# Patient Record
Sex: Male | Born: 1970 | Race: White | Hispanic: No | Marital: Married | Smoking: Never smoker
Health system: Southern US, Community
[De-identification: ages and names within clinical notes are randomized; demographics above are authoritative.]

---

## 2006-06-16 ENCOUNTER — Ambulatory Visit: Payer: Self-pay | Admitting: General Practice

## 2016-03-21 ENCOUNTER — Ambulatory Visit: Payer: Self-pay | Admitting: Family Medicine

## 2016-03-23 ENCOUNTER — Other Ambulatory Visit: Payer: Self-pay | Admitting: Family Medicine

## 2016-03-23 ENCOUNTER — Ambulatory Visit (INDEPENDENT_AMBULATORY_CARE_PROVIDER_SITE_OTHER): Admitting: Family Medicine

## 2016-03-23 ENCOUNTER — Encounter: Payer: Self-pay | Admitting: Family Medicine

## 2016-03-23 ENCOUNTER — Other Ambulatory Visit: Payer: Self-pay

## 2016-03-23 VITALS — BP 110/80 | HR 81 | Temp 98.1°F | Resp 16 | Ht 69.0 in | Wt 212.0 lb

## 2016-03-23 DIAGNOSIS — E669 Obesity, unspecified: Secondary | ICD-10-CM | POA: Diagnosis not present

## 2016-03-23 DIAGNOSIS — M179 Osteoarthritis of knee, unspecified: Secondary | ICD-10-CM | POA: Insufficient documentation

## 2016-03-23 DIAGNOSIS — R748 Abnormal levels of other serum enzymes: Secondary | ICD-10-CM | POA: Diagnosis not present

## 2016-03-23 DIAGNOSIS — M1710 Unilateral primary osteoarthritis, unspecified knee: Secondary | ICD-10-CM | POA: Insufficient documentation

## 2016-03-23 DIAGNOSIS — H6981 Other specified disorders of Eustachian tube, right ear: Secondary | ICD-10-CM | POA: Diagnosis not present

## 2016-03-23 DIAGNOSIS — Z6831 Body mass index (BMI) 31.0-31.9, adult: Secondary | ICD-10-CM | POA: Insufficient documentation

## 2016-03-23 MED ORDER — GLUCOSAMINE-CHONDROITIN 500-400 MG PO TABS: 1.0000 | ORAL_TABLET | Freq: Two times a day (BID) | ORAL | Status: AC

## 2016-03-23 NOTE — Progress Notes (Signed)
Date:  03/23/2016   Name:  Wayne Mcgee   DOB:  1971/05/14   MRN:  [id number]  PCP:  Schuyler AmorWilliam Lonetta Blassingame, MD    Chief Complaint: Establish Care   History of Present Illness:  This is a 45 y.o. male for initial visit. Active [employer]. Placed on abx for R ear infection 2 weeks ago, better but still having sinus congestion and ear popping. Taking G/C for int knee pain. Weight up recently, just started new diet. Father died with alcoholism, mother died with brain infection. Last tetanus 2015.   Review of Systems:  Review of Systems  Constitutional: Negative for fever and fatigue.  Respiratory: Negative for cough and shortness of breath.   Cardiovascular: Negative for chest pain and leg swelling.  Genitourinary: Negative for difficulty urinating.  Neurological: Negative for syncope and light-headedness.    Patient Active Problem List   Diagnosis Date Noted  . BMI 31.0-31.9,adult 03/23/2016    Prior to Admission medications   Not on File    No Known Allergies  History reviewed. No pertinent past surgical history.  Social History  Substance Use Topics  . Smoking status: Never Smoker   . Smokeless tobacco: Never Used  . Alcohol Use: No    History reviewed. No pertinent family history.  Medication list has been reviewed and updated.  Physical Examination: BP 110/80 mmHg  Pulse 81  Temp(Src) 98.1 F (36.7 C) (Oral)  Resp 16  Ht 5\' 9"  (1.753 m)  Wt 212 lb (96.163 kg)  BMI 31.29 kg/m2  SpO2 97%  Physical Exam  Constitutional: He is oriented to person, place, and time. He appears well-developed and well-nourished.  HENT:  Head: Normocephalic and atraumatic.  Right Ear: External ear normal.  Left Ear: External ear normal.  Nose: Nose normal.  Mouth/Throat: Oropharynx is clear and moist.  R TM retracted  Eyes: Conjunctivae and EOM are normal. Pupils are equal, round, and reactive to light.  Neck: Neck supple. No thyromegaly present.  Cardiovascular: Normal rate,  regular rhythm and normal heart sounds.   Pulmonary/Chest: Effort normal and breath sounds normal.  Abdominal: Soft. He exhibits no distension and no mass. There is no tenderness.  Musculoskeletal: He exhibits no edema.  Lymphadenopathy:    He has no cervical adenopathy.  Neurological: He is alert and oriented to person, place, and time. Coordination normal.  Skin: Skin is warm and dry.  Psychiatric: He has a normal mood and affect. His behavior is normal.  Nursing note and vitals reviewed.   Assessment and Plan:  1. ETD (eustachian tube dysfunction), right Sudafed or Afrin x 3d  2. BMI 31.0-31.9,adult Diet/exercise discussed - Comprehensive Metabolic Panel (CMET) - CBC - Lipid Profile  Return in about 6 months (around 09/23/2016).  Dionne AnoWilliam M. Kingsley SpittlePlonk, Jr. MD [facility]  03/23/2016

## 2016-03-24 LAB — LIPID PANEL
CHOLESTEROL TOTAL: 227 mg/dL — AB (ref 100–199)
Chol/HDL Ratio: 4.4 ratio units (ref 0.0–5.0)
HDL: 52 mg/dL (ref >39)
LDL Calculated: 141 mg/dL — ABNORMAL HIGH (ref 0–99)
TRIGLYCERIDES: 169 mg/dL — AB (ref 0–149)
VLDL Cholesterol Cal: 34 mg/dL (ref 5–40)

## 2016-03-24 LAB — COMPREHENSIVE METABOLIC PANEL
A/G RATIO: 2 (ref 1.2–2.2)
ALBUMIN: 4.9 g/dL (ref 3.5–5.5)
ALK PHOS: 85 IU/L (ref 39–117)
ALT: 54 IU/L — ABNORMAL HIGH (ref 0–44)
AST: 44 IU/L — ABNORMAL HIGH (ref 0–40)
BILIRUBIN TOTAL: 0.5 mg/dL (ref 0.0–1.2)
BUN / CREAT RATIO: 21 — AB (ref 9–20)
BUN: 22 mg/dL (ref 6–24)
CHLORIDE: 98 mmol/L (ref 96–106)
CO2: 25 mmol/L (ref 18–29)
Calcium: 9.5 mg/dL (ref 8.7–10.2)
Creatinine, Ser: 1.04 mg/dL (ref 0.76–1.27)
GFR calc non Af Amer: 87 mL/min/{1.73_m2} (ref >59)
GFR, EST AFRICAN AMERICAN: 100 mL/min/{1.73_m2} (ref >59)
GLUCOSE: 73 mg/dL (ref 65–99)
Globulin, Total: 2.5 g/dL (ref 1.5–4.5)
POTASSIUM: 4.6 mmol/L (ref 3.5–5.2)
SODIUM: 139 mmol/L (ref 134–144)
TOTAL PROTEIN: 7.4 g/dL (ref 6.0–8.5)

## 2016-03-24 LAB — CBC
Hematocrit: 44.7 % (ref 37.5–51.0)
Hemoglobin: 15.3 g/dL (ref 12.6–17.7)
MCH: 29.7 pg (ref 26.6–33.0)
MCHC: 34.2 g/dL (ref 31.5–35.7)
MCV: 87 fL (ref 79–97)
PLATELETS: 265 10*3/uL (ref 150–379)
RBC: 5.15 x10E6/uL (ref 4.14–5.80)
RDW: 12.8 % (ref 12.3–15.4)
WBC: 7.8 10*3/uL (ref 3.4–10.8)

## 2016-03-24 NOTE — Addendum Note (Signed)
Addended by: Schuyler AmorPLONK, Janaiyah Blackard on: 03/24/2016 09:50 AM   Modules accepted: Orders

## 2016-03-31 ENCOUNTER — Ambulatory Visit
Admission: RE | Admit: 2016-03-31 | Discharge: 2016-03-31 | Disposition: A | Discharge status: Home / Self Care | Source: Ambulatory Visit | Attending: Family Medicine | Admitting: Family Medicine

## 2016-03-31 ENCOUNTER — Other Ambulatory Visit: Payer: Self-pay | Admitting: Family Medicine

## 2016-03-31 DIAGNOSIS — R748 Abnormal levels of other serum enzymes: Secondary | ICD-10-CM

## 2016-04-01 ENCOUNTER — Telehealth: Payer: Self-pay

## 2016-04-01 NOTE — Telephone Encounter (Signed)
-----   Message from Schuyler AmorWilliam Plonk, MD sent at 03/31/2016  1:54 PM EDT ----- Inform liver US normal, recommend limit alcohol use to 2 drinks daily and return next week for repeat blood test (ordered).

## 2016-04-01 NOTE — Telephone Encounter (Signed)
Spoke with pt. Wife. Advised and will be by next week to have labs drawn.

## 2016-04-07 ENCOUNTER — Other Ambulatory Visit: Payer: Self-pay

## 2016-04-07 ENCOUNTER — Other Ambulatory Visit: Payer: Self-pay | Admitting: Family Medicine

## 2016-04-08 LAB — HEPATITIS, DIAGNOSTIC (PROF I)
HEP A IGM: NEGATIVE
HEP B C IGM: NEGATIVE
HEP B S AG: NEGATIVE

## 2016-04-08 LAB — HEPATITIS C ANTIBODY

## 2016-04-08 LAB — HEPATIC FUNCTION PANEL
ALBUMIN: 4.9 g/dL (ref 3.5–5.5)
ALT: 32 IU/L (ref 0–44)
AST: 36 IU/L (ref 0–40)
Alkaline Phosphatase: 89 IU/L (ref 39–117)
Bilirubin Total: 0.5 mg/dL (ref 0.0–1.2)
Bilirubin, Direct: 0.15 mg/dL (ref 0.00–0.40)
TOTAL PROTEIN: 7.4 g/dL (ref 6.0–8.5)

## 2018-01-31 ENCOUNTER — Encounter: Payer: Self-pay | Admitting: Emergency Medicine

## 2018-01-31 ENCOUNTER — Other Ambulatory Visit: Payer: Self-pay

## 2018-01-31 ENCOUNTER — Ambulatory Visit
Admission: EM | Admit: 2018-01-31 | Discharge: 2018-01-31 | Disposition: A | Discharge status: Home / Self Care | Attending: Family Medicine | Admitting: Family Medicine

## 2018-01-31 DIAGNOSIS — M2550 Pain in unspecified joint: Secondary | ICD-10-CM | POA: Diagnosis not present

## 2018-01-31 DIAGNOSIS — M26622 Arthralgia of left temporomandibular joint: Secondary | ICD-10-CM | POA: Diagnosis not present

## 2018-01-31 DIAGNOSIS — H9202 Otalgia, left ear: Secondary | ICD-10-CM

## 2018-01-31 DIAGNOSIS — M26629 Arthralgia of temporomandibular joint, unspecified side: Secondary | ICD-10-CM

## 2018-01-31 NOTE — ED Provider Notes (Signed)
[facility]    CSN: [id number] Arrival date & time: 01/31/18  0921     History   Chief Complaint Chief Complaint  Patient presents with  . Jaw Pain  . Otalgia    left    HPI Wayne Mcgee is a 47 y.o. male.   47 yo male with a c/o left jaw and ear pain for the past week, worse when chewing. Denies any injuries, trauma, fevers, chills, ear drainage, redness, rash. States has been under more stress recently and may have been grinding his teeth.   Also c/o right hip and left foot pain on and off the 2 days. Denies any injuries, rash, redness, swelling, fevers, chills.     Otalgia    History reviewed. No pertinent past medical history.  Patient Active Problem List   Diagnosis Date Noted  . BMI 31.0-31.9,adult 03/23/2016  . Knee osteoarthritis 03/23/2016    History reviewed. No pertinent surgical history.     Home Medications    Prior to Admission medications   Medication Sig Start Date End Date Taking? Authorizing Provider  glucosamine-chondroitin (MAX GLUCOSAMINE CHONDROITIN) 500-400 MG tablet Take 1 tablet by mouth 2 (two) times daily. 03/23/16   Schuyler AmorPlonk, William, MD    Family History History reviewed. No pertinent family history.  Social History Social History   Tobacco Use  . Smoking status: Never Smoker  . Smokeless tobacco: Never Used  Substance Use Topics  . Alcohol use: No  . Drug use: No     Allergies   Sulfa antibiotics   Review of Systems Review of Systems  HENT: Positive for ear pain.      Physical Exam Triage Vital Signs ED Triage Vitals  Enc Vitals Group     BP 01/31/18 0957 131/87     Pulse Rate 01/31/18 0957 81     Resp 01/31/18 0957 16     Temp 01/31/18 0957 98.3 F (36.8 C)     Temp Source 01/31/18 0957 Oral     SpO2 01/31/18 0957 99 %     Weight 01/31/18 0954 190 lb (86.2 kg)     Height 01/31/18 0954 5\' 10"  (1.778 m)     Head Circumference --      Peak Flow --      Pain Score 01/31/18 0954 3     Pain  Loc --      Pain Edu? --      Excl. in GC? --    No data found.  Updated Vital Signs BP 131/87 (BP Location: Left Arm)   Pulse 81   Temp 98.3 F (36.8 C) (Oral)   Resp 16   Ht 5\' 10"  (1.778 m)   Wt 190 lb (86.2 kg)   SpO2 99%   BMI 27.26 kg/m   Visual Acuity Right Eye Distance:   Left Eye Distance:   Bilateral Distance:    Right Eye Near:   Left Eye Near:    Bilateral Near:     Physical Exam  Constitutional: He appears well-developed and well-nourished.  Non-toxic appearance. He does not have a sickly appearance. He does not appear ill. No distress.  HENT:  Head: Normocephalic and atraumatic.  Right Ear: Tympanic membrane and external ear normal.  Left Ear: External ear normal.  Nose: Nose normal.  Mouth/Throat: Uvula is midline and oropharynx is clear and moist. No oropharyngeal exudate. No tonsillar exudate.  Neck: Trachea normal, normal range of motion and full passive range of motion without pain.  Neck supple.  Tenderness to palpation at left TMJ  Musculoskeletal:       Left foot: There is tenderness (over the skin; no lesions, redness, swelling or drainage). There is normal range of motion, no bony tenderness, no swelling, normal capillary refill, no crepitus, no deformity and no laceration.  Skin: He is not diaphoretic.  Nursing note and vitals reviewed.    UC Treatments / Results  Labs (all labs ordered are listed, but only abnormal results are displayed) Labs Reviewed - No data to display  EKG  EKG Interpretation None       Radiology No results found.  Procedures Procedures (including critical care time)  Medications Ordered in UC Medications - No data to display   Initial Impression / Assessment and Plan / UC Course  I have reviewed the triage vital signs and the nursing notes.  Pertinent labs & imaging results that were available during my care of the patient were reviewed by me and considered in my medical decision making (see chart for  details).       Final Clinical Impressions(s) / UC Diagnoses   Final diagnoses:  TMJ syndrome  Arthralgia, unspecified joint    ED Discharge Orders    None     1. diagnosis reviewed with patient 2. Recommend supportive treatment with otc analgesics/NSAIDs, ice 3. Follow-up prn if symptoms worsen or don't improve  Controlled Substance Prescriptions [place] Controlled Substance Registry consulted? Not Applicable   Payton Mccallum, MD 01/31/18 [phone]

## 2018-01-31 NOTE — Discharge Instructions (Signed)
Ibuprofen 600mg  three times daily ice

## 2018-01-31 NOTE — ED Triage Notes (Signed)
Patient c/o pain on the left side of his jaw when he chews for a week and then started having pain in his left ear.  Patient c/o pain in his right hip and left foot off and on for couple days.

## 2018-02-14 IMAGING — US US ABDOMEN LIMITED
1 series · 14 of 25 positions shown · non-contrast
Comparison: None.

CLINICAL DATA: Elevated liver enzymes

EXAM:
US ABDOMEN LIMITED - RIGHT UPPER QUADRANT

[Series 1: us abdomen limited · 0.25mm/px · 14 of 45 slices shown]
[im 1/45]
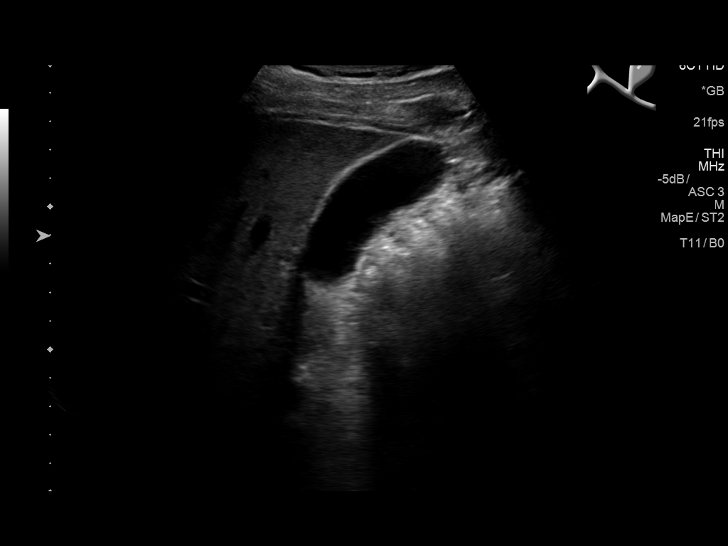
[im 4/45]
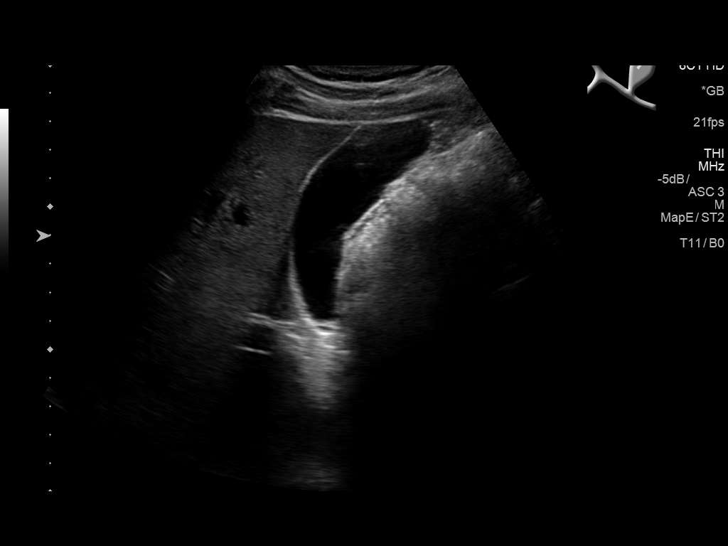
[im 8/45]
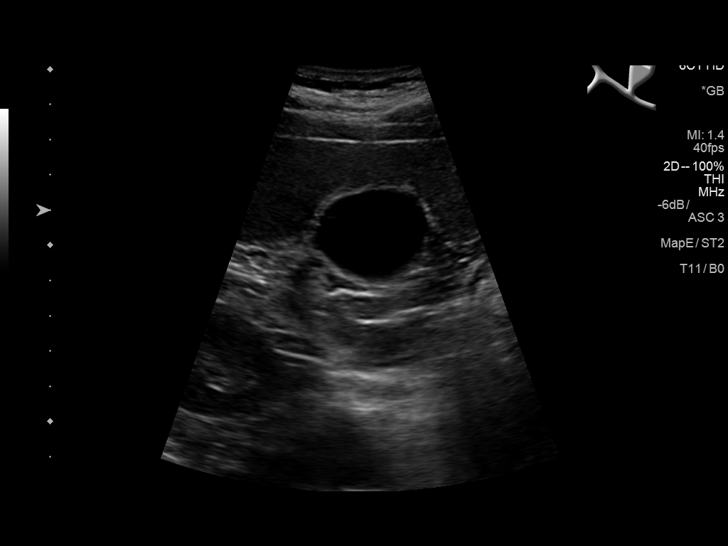
[im 12/45]
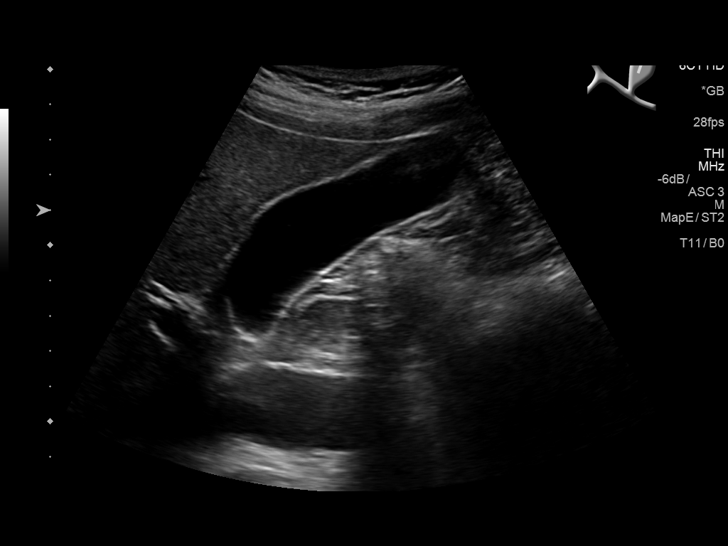
[im 15/45]
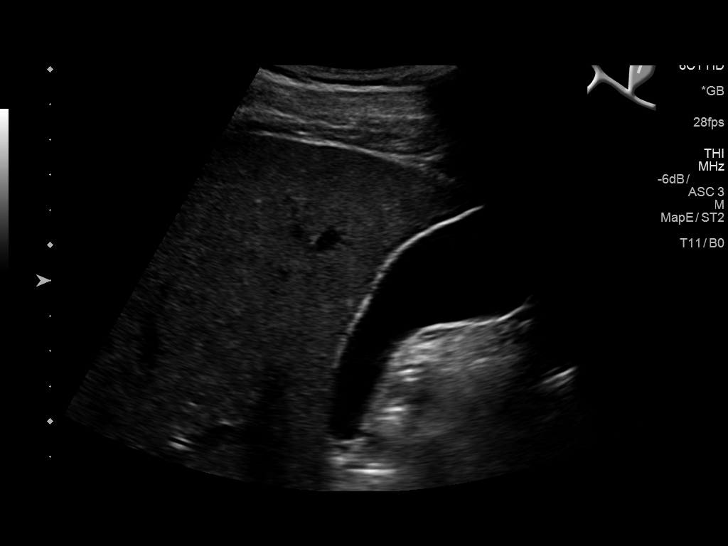
[im 17/45]
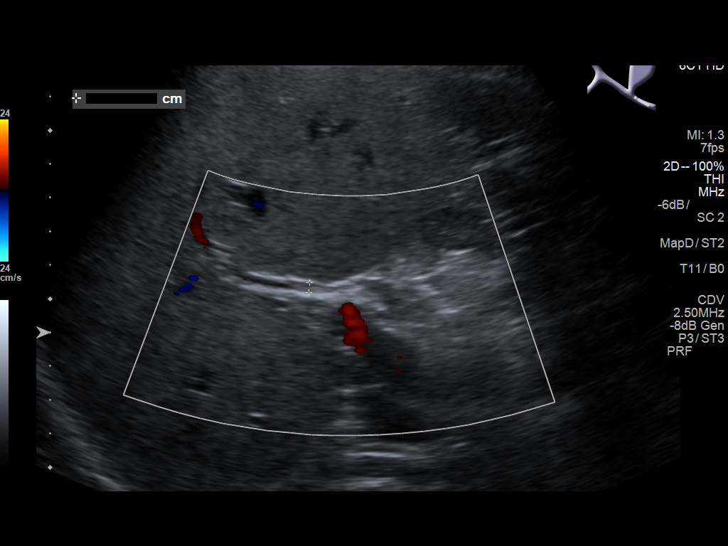
[im 21/45]
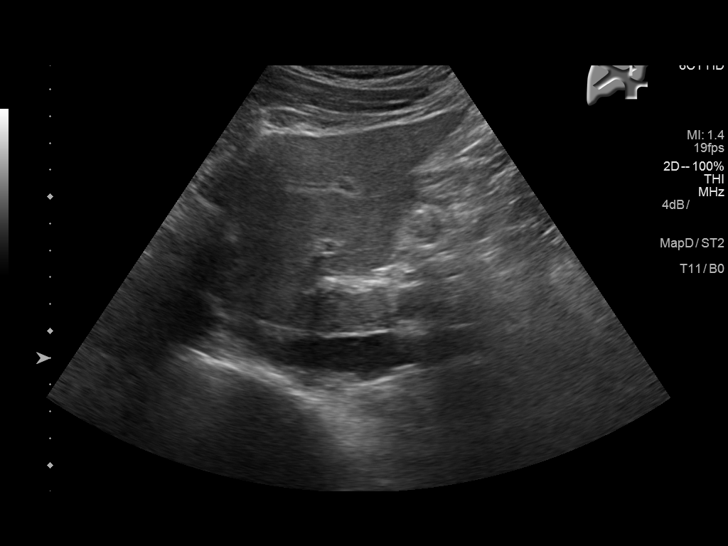
[im 24/45]
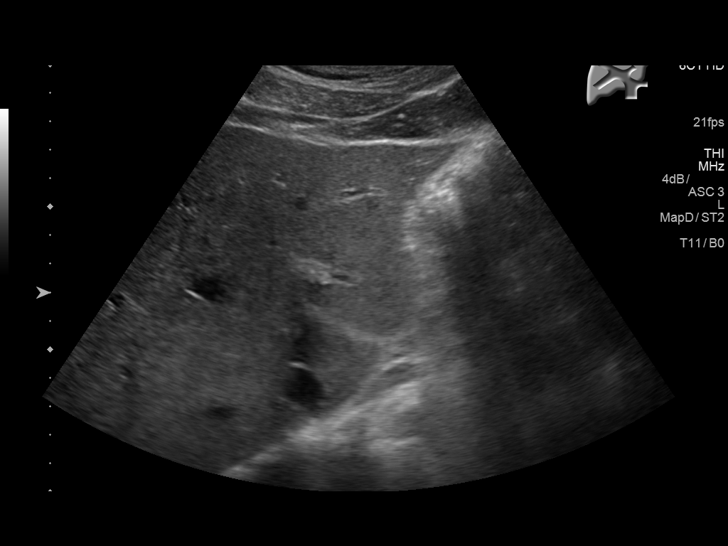
[im 28/45]
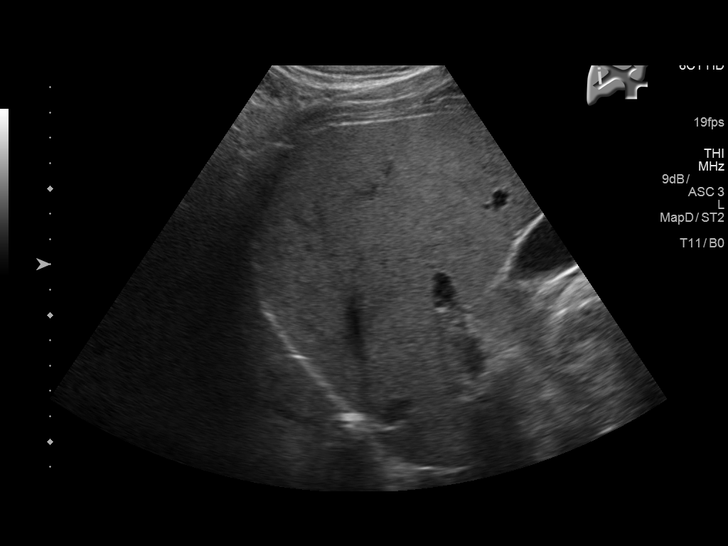
[im 30/45]
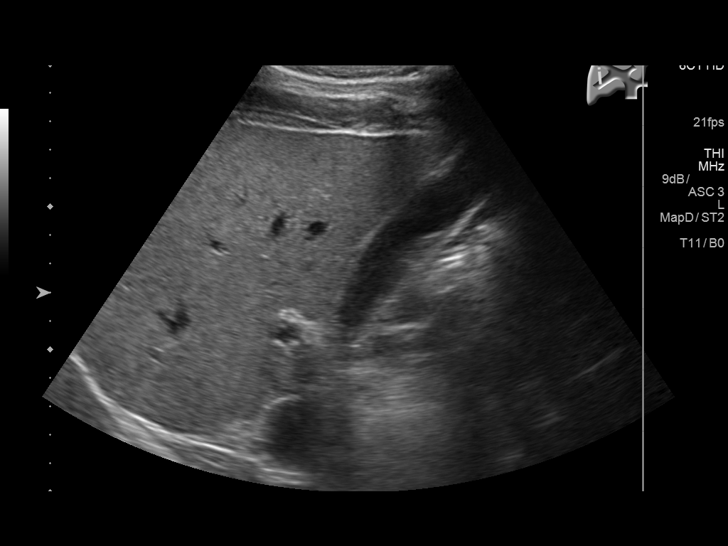
[im 34/45]
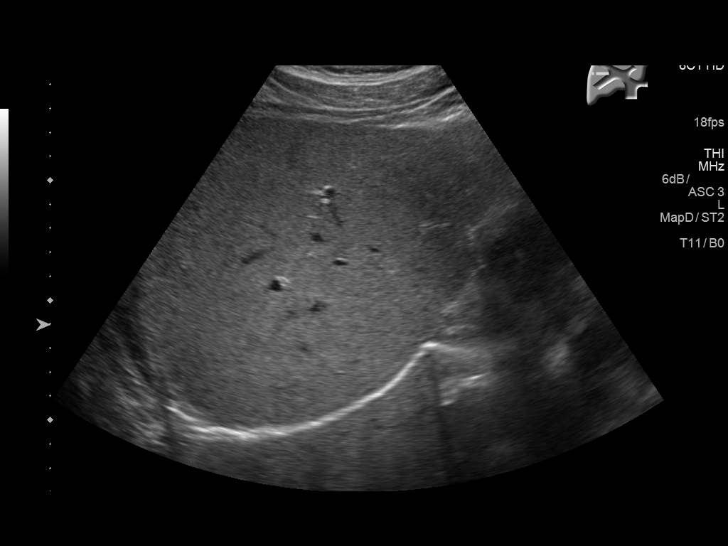
[im 37/45]
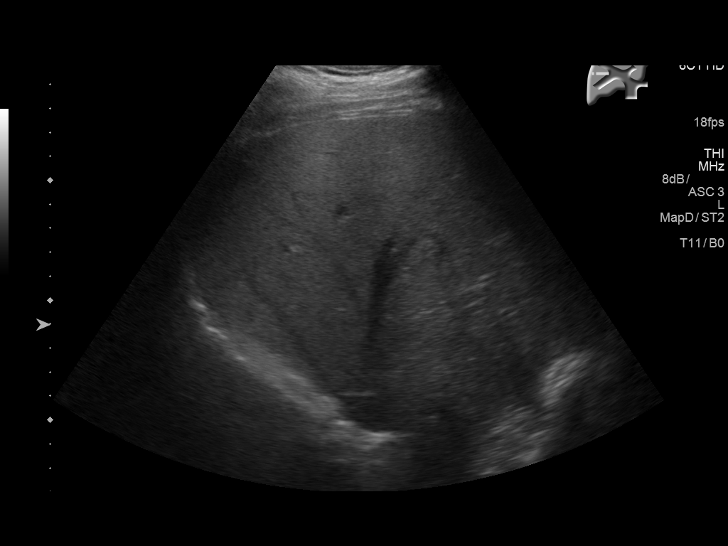
[im 41/45]
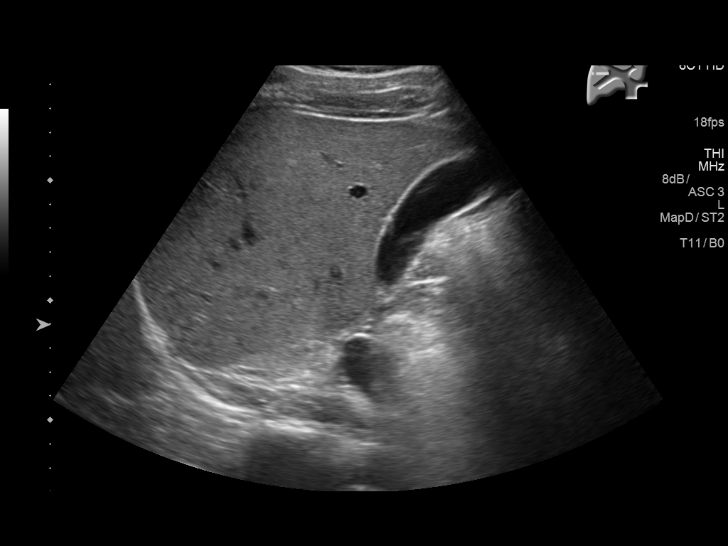
[im 45/45]
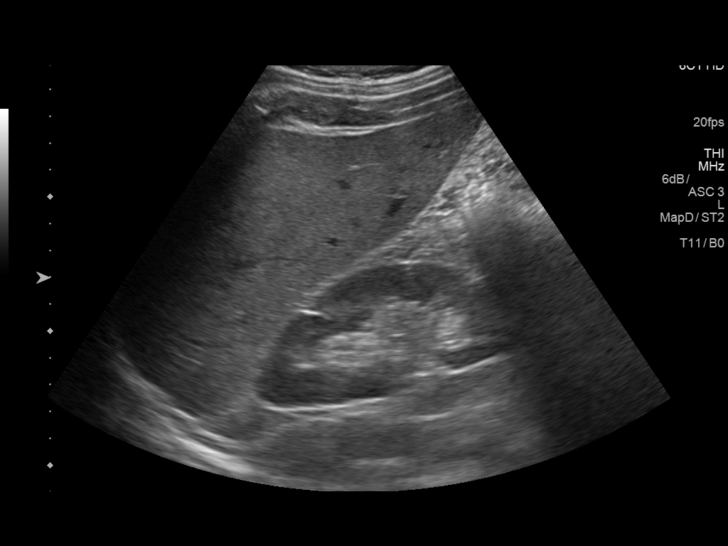

[14 of 25 positions shown; findings below may reference images not displayed]

FINDINGS: Gallbladder:

No gallstones or wall thickening visualized. There is no
pericholecystic fluid. No sonographic Murphy sign noted by
sonographer.

Common bile duct:

Diameter: 3 mm. No intrahepatic or extrahepatic biliary duct
dilatation.

Liver:

No focal lesion identified. Within normal limits in parenchymal
echogenicity.
IMPRESSION: Study within normal limits.

## 2018-08-28 DIAGNOSIS — Z148 Genetic carrier of other disease: Secondary | ICD-10-CM | POA: Insufficient documentation

## 2020-01-27 DIAGNOSIS — U071 COVID-19: Secondary | ICD-10-CM | POA: Insufficient documentation

## 2020-04-29 DIAGNOSIS — S43431A Superior glenoid labrum lesion of right shoulder, initial encounter: Secondary | ICD-10-CM | POA: Insufficient documentation

## 2020-05-28 DIAGNOSIS — F4310 Post-traumatic stress disorder, unspecified: Secondary | ICD-10-CM | POA: Insufficient documentation

## 2021-11-26 DIAGNOSIS — M84376A Stress fracture, unspecified foot, initial encounter for fracture: Secondary | ICD-10-CM | POA: Insufficient documentation

## 2021-11-26 DIAGNOSIS — M722 Plantar fascial fibromatosis: Secondary | ICD-10-CM | POA: Insufficient documentation

## 2022-02-03 DIAGNOSIS — H519 Unspecified disorder of binocular movement: Secondary | ICD-10-CM | POA: Insufficient documentation

## 2022-02-03 DIAGNOSIS — H5030 Unspecified intermittent heterotropia: Secondary | ICD-10-CM | POA: Insufficient documentation

## 2022-02-03 DIAGNOSIS — H5022 Vertical strabismus, left eye: Secondary | ICD-10-CM | POA: Insufficient documentation

## 2022-02-03 DIAGNOSIS — H532 Diplopia: Secondary | ICD-10-CM | POA: Insufficient documentation

## 2022-02-03 DIAGNOSIS — H4912 Fourth [trochlear] nerve palsy, left eye: Secondary | ICD-10-CM | POA: Insufficient documentation

## 2022-08-26 ENCOUNTER — Ambulatory Visit (INDEPENDENT_AMBULATORY_CARE_PROVIDER_SITE_OTHER)

## 2022-08-26 ENCOUNTER — Ambulatory Visit
Admission: EM | Admit: 2022-08-26 | Discharge: 2022-08-26 | Disposition: A | Discharge status: Home / Self Care | Attending: Emergency Medicine | Admitting: Emergency Medicine

## 2022-08-26 DIAGNOSIS — M7051 Other bursitis of knee, right knee: Secondary | ICD-10-CM | POA: Diagnosis not present

## 2022-08-26 DIAGNOSIS — M25561 Pain in right knee: Secondary | ICD-10-CM | POA: Diagnosis not present

## 2022-08-26 LAB — CBC WITH DIFFERENTIAL/PLATELET
Abs Immature Granulocytes: 0.04 10*3/uL (ref 0.00–0.07)
Basophils Absolute: 0.1 10*3/uL (ref 0.0–0.1)
Basophils Relative: 1 %
Eosinophils Absolute: 0.1 10*3/uL (ref 0.0–0.5)
Eosinophils Relative: 1 %
HCT: 43.5 % (ref 39.0–52.0)
Hemoglobin: 15.1 g/dL (ref 13.0–17.0)
Immature Granulocytes: 1 %
Lymphocytes Relative: 25 %
Lymphs Abs: 1.7 10*3/uL (ref 0.7–4.0)
MCH: 29.9 pg (ref 26.0–34.0)
MCHC: 34.7 g/dL (ref 30.0–36.0)
MCV: 86.1 fL (ref 80.0–100.0)
Monocytes Absolute: 0.5 10*3/uL (ref 0.1–1.0)
Monocytes Relative: 8 %
Neutro Abs: 4.3 10*3/uL (ref 1.7–7.7)
Neutrophils Relative %: 64 %
Platelets: 264 10*3/uL (ref 150–400)
RBC: 5.05 MIL/uL (ref 4.22–5.81)
RDW: 11.9 % (ref 11.5–15.5)
WBC: 6.7 10*3/uL (ref 4.0–10.5)
nRBC: 0 % (ref 0.0–0.2)

## 2022-08-26 LAB — COMPREHENSIVE METABOLIC PANEL
ALT: 35 U/L (ref 0–44)
AST: 26 U/L (ref 15–41)
Albumin: 4.6 g/dL (ref 3.5–5.0)
Alkaline Phosphatase: 76 U/L (ref 38–126)
Anion gap: 5 (ref 5–15)
BUN: 20 mg/dL (ref 6–20)
CO2: 27 mmol/L (ref 22–32)
Calcium: 9.2 mg/dL (ref 8.9–10.3)
Chloride: 106 mmol/L (ref 98–111)
Creatinine, Ser: 0.99 mg/dL (ref 0.61–1.24)
GFR, Estimated: >60 mL/min (ref >60)
Glucose, Bld: 89 mg/dL (ref 70–99)
Potassium: 4.1 mmol/L (ref 3.5–5.1)
Sodium: 138 mmol/L (ref 135–145)
Total Bilirubin: 0.5 mg/dL (ref 0.3–1.2)
Total Protein: 7.5 g/dL (ref 6.5–8.1)

## 2022-08-26 LAB — SEDIMENTATION RATE: Sed Rate: 2 mm/hr (ref 0–20)

## 2022-08-26 MED ORDER — CEPHALEXIN 500 MG PO CAPS: 500.0000 mg | ORAL_CAPSULE | Freq: Three times a day (TID) | ORAL | 0 refills | Status: AC

## 2022-08-26 MED ORDER — PREDNISONE 20 MG PO TABS
60.0000 mg | ORAL_TABLET | Freq: Every day | ORAL | 0 refills | Status: AC
Start: 2022-08-26 — End: 2022-08-31

## 2022-08-26 NOTE — ED Provider Notes (Signed)
[facility]    CSN: [id number] Arrival date & time: 08/26/22  0865      History   Chief Complaint Chief Complaint  Patient presents with   Bleeding/Bruising   Knee Pain    HPI Wayne Mcgee is a 51 y.o. male.   HPI  51 year old male here for evaluation of right knee pain.  Patient reports that she has been experiencing pain and swelling over his tibial tuberosity in his right knee for the last 5 days.  He states that he noticed a small scab on the inside of the swelling but does not remember any particular injury.  He does not remember any spider bite or snake bites though he states that he has killed many copperhead's on his property here recently.  He also has noticed bruising going down the front of his shin and pain and swelling in his calf muscle with some numbness and tingling.  He states he has no numbness or tingling in his foot or ankle.  He does have some tenderness on the lateral aspect of his proximal right lower leg.  He denies any fever.  History reviewed. No pertinent past medical history.  Patient Active Problem List   Diagnosis Date Noted   BMI 31.0-31.9,adult 03/23/2016   Knee osteoarthritis 03/23/2016    History reviewed. No pertinent surgical history.     Home Medications    Prior to Admission medications   Medication Sig Start Date End Date Taking? Authorizing Provider  cephALEXin (KEFLEX) 500 MG capsule Take 1 capsule (500 mg total) by mouth 3 (three) times daily for 7 days. 08/26/22 09/02/22 Yes Becky Augusta, NP  glucosamine-chondroitin (MAX GLUCOSAMINE CHONDROITIN) 500-400 MG tablet Take 1 tablet by mouth 2 (two) times daily. 03/23/16  Yes Plonk, Chrissie Noa, MD  predniSONE (DELTASONE) 20 MG tablet Take 3 tablets (60 mg total) by mouth daily with breakfast for 5 days. 3 tablets daily for 5 days. 08/26/22 08/31/22 Yes Becky Augusta, NP    Family History History reviewed. No pertinent family history.  Social History Social History   Tobacco  Use   Smoking status: Never   Smokeless tobacco: Never  Vaping Use   Vaping Use: Never used  Substance Use Topics   Alcohol use: No   Drug use: No     Allergies   Sulfa antibiotics   Review of Systems Review of Systems  Constitutional:  Negative for fever.  Musculoskeletal:  Positive for arthralgias, joint swelling and myalgias.  Skin:  Positive for color change and wound.  Neurological:  Positive for numbness. Negative for weakness.  Hematological: Negative.   Psychiatric/Behavioral: Negative.       Physical Exam Triage Vital Signs ED Triage Vitals  Enc Vitals Group     BP 08/26/22 0840 132/89     Pulse Rate 08/26/22 0840 65     Resp 08/26/22 0840 18     Temp 08/26/22 0840 98.5 F (36.9 C)     Temp Source 08/26/22 0840 Oral     SpO2 08/26/22 0840 100 %     Weight 08/26/22 0838 194 lb (88 kg)     Height 08/26/22 0838 5\' 9"  (1.753 m)     Head Circumference --      Peak Flow --      Pain Score 08/26/22 0835 4     Pain Loc --      Pain Edu? --      Excl. in GC? --    No data found.  Updated Vital Signs BP 132/89 (BP Location: Left Arm)   Pulse 65   Temp 98.5 F (36.9 C) (Oral)   Resp 18   Ht 5\' 9"  (1.753 m)   Wt 194 lb (88 kg)   SpO2 100%   BMI 28.65 kg/m   Visual Acuity Right Eye Distance:   Left Eye Distance:   Bilateral Distance:    Right Eye Near:   Left Eye Near:    Bilateral Near:     Physical Exam Vitals and nursing note reviewed.  Constitutional:      Appearance: Normal appearance. He is not ill-appearing.  HENT:     Head: Normocephalic and atraumatic.  Musculoskeletal:        General: Swelling and tenderness present. No deformity.  Skin:    General: Skin is warm and dry.     Capillary Refill: Capillary refill takes less than 2 seconds.     Findings: Bruising present.  Neurological:     General: No focal deficit present.     Mental Status: He is alert and oriented to person, place, and time.  Psychiatric:        Mood and  Affect: Mood normal.        Behavior: Behavior normal.        Thought Content: Thought content normal.        Judgment: Judgment normal.      UC Treatments / Results  Labs (all labs ordered are listed, but only abnormal results are displayed) Labs Reviewed  CBC WITH DIFFERENTIAL/PLATELET  COMPREHENSIVE METABOLIC PANEL  SEDIMENTATION RATE    EKG   Radiology DG Knee Complete 4 Views Right  Result Date: 08/26/2022 CLINICAL DATA:  Pain EXAM: RIGHT KNEE - COMPLETE 4 VIEW COMPARISON:  None Available. FINDINGS: No evidence of fracture or dislocation. Trace joint effusion. No evidence of arthropathy or other focal bone abnormality. Soft tissues are unremarkable. IMPRESSION: 1. No acute osseous abnormality. 2. Trace joint effusion. Electronically Signed   By: 10/26/2022 M.D.   On: 08/26/2022 09:21    Procedures Procedures (including critical care time)  Medications Ordered in UC Medications - No data to display  Initial Impression / Assessment and Plan / UC Course  I have reviewed the triage vital signs and the nursing notes.  Pertinent labs & imaging results that were available during my care of the patient were reviewed by me and considered in my medical decision making (see chart for details).   Patient is a pleasant 51 year old male who is currently attending the [employer] and presents for 5 days worth of pain and swelling to his right knee and right lower leg that is also associated with some bruising.  He noticed the swelling first 5 days ago over his tibial tuberosity as well as a small wound on the medial aspect of the swelling.  He was taking ibuprofen for the pain and swelling as well as putting some ice on the knee.  Yesterday he noticed bruising going down the front of his shin and pain and swelling in his calf along with numbness and tingling.        Patient's leg is warm and dry.  He does have tenderness when compressing the calf but he states this  mostly on the lateral aspect.  There is no appreciable cording or prominent vasculature noted.  His distal DP and PT pulses are 2+.  He has full range of motion and sensation in his toes, foot, and ankle.  He  does not complain of any pain with compression of the medial lateral malleolus, anterior ankle joint, midfoot, metatarsals, or phalanges.  He does endorse some mild pain on the medial aspect of his heel but no pain with palpation of the calcaneus.  There is a small vertical scab on the medial aspect of the swelling over the tibial tuberosity but patient does not member any particular injury.  This does not look like a site of envenomation from either snake or spider.  There are 2 small erythematous maculopapular lesions on the lateral aspect of the calf which do appear to be around hair follicles and or possibly developing folliculitis secondary to patient wearing high boots and the swelling.  Differential diagnosis include puncture wound, traumatic bursitis, or tibial plateau fracture.  I will order radiograph of the right knee to rule out any kind of bony abnormality.  I will also check CBC, CMP, and sed rate.  BC shows a normal white count of 6.7.  H&H is 15.1 and 43.5 respectively.  Platelets are 264.  The differential is unremarkable.  Right knee x-rays independently reviewed and evaluated by me.  Impression: There is no evidence of fracture or dislocation.  There is mild soft tissue swelling anterior and superior to the tibial tuberosity.  There is a mild joint effusion.  Radiology overread is pending. Radiology impression states no evidence of fracture or dislocation and there is a trace joint effusion.  No evidence of arthropathy or focal bone abnormality.  Soft tissues are unremarkable.  CMP shows normal sodium potassium at 138 and 4.1 respectively.  Renal function is normal with a BUN of 20 and a creatinine of 0.99.  Transaminases are unremarkable.  Sedimentation rate is 2.  Patient's blood  work does not demonstrate any evidence of systemic infection or concern for possible envenomation as his inflammatory markers are completely unremarkable.  His x-ray also does not show any evidence of bony abnormality.  I will treat the patient for traumatic bursitis with 60 mg burst dose of prednisone and also cover him for potential bacterial infection with Keflex 500 mg 3 times a day for a week.   Final Clinical Impressions(s) / UC Diagnoses   Final diagnoses:  Infrapatellar bursitis of right knee     Discharge Instructions      Your x-rays today did not demonstrate any evidence of fracture.  You do have a small amount of fluid in your knee joint but there is not a large effusion.  Your blood work did not demonstrate any evidence of systemic infection.  I believe that you have infrapatellar bursitis and the bruising is coming from continued inflammation and tissue swelling.  Take the prednisone 60 mg daily with food.  Take your first dose when you pick up the prescription this morning and then each dose going forward will be at breakfast time.  We will also put you on Keflex 3 times a day prophylactically.  If you develop any increased swelling of your lower leg, numbness or tingling in her foot, redness, heat, or fever please return for reevaluation or seek care in the emergency department.     ED Prescriptions     Medication Sig Dispense Auth. Provider   predniSONE (DELTASONE) 20 MG tablet Take 3 tablets (60 mg total) by mouth daily with breakfast for 5 days. 3 tablets daily for 5 days. 15 tablet Becky Augusta, NP   cephALEXin (KEFLEX) 500 MG capsule Take 1 capsule (500 mg total) by mouth 3 (three) times  daily for 7 days. 20 capsule Becky Augusta, NP      PDMP not reviewed this encounter.   Becky Augusta, NP 08/26/22 1035

## 2022-08-26 NOTE — ED Triage Notes (Signed)
Pt c/o pain when moving and walking. X3days  Pt states that the swelling is just under the knee. Pt has bruising along the shin and calf.   Pt has a small scab along the knee and is unsure if he has had an insect bite or a snake bite.   Pt has swelling and bruising along the medial side of ankle.   Pt denies any physical activity or training.

## 2022-08-26 NOTE — Discharge Instructions (Addendum)
Your x-rays today did not demonstrate any evidence of fracture.  You do have a small amount of fluid in your knee joint but there is not a large effusion.  Your blood work did not demonstrate any evidence of systemic infection.  I believe that you have infrapatellar bursitis and the bruising is coming from continued inflammation and tissue swelling.  Take the prednisone 60 mg daily with food.  Take your first dose when you pick up the prescription this morning and then each dose going forward will be at breakfast time.  We will also put you on Keflex 3 times a day prophylactically.  If you develop any increased swelling of your lower leg, numbness or tingling in her foot, redness, heat, or fever please return for reevaluation or seek care in the emergency department.

## 2023-10-16 ENCOUNTER — Ambulatory Visit: Admission: EM | Admit: 2023-10-16 | Discharge: 2023-10-16 | Disposition: A | Discharge status: Home / Self Care

## 2023-10-16 DIAGNOSIS — R2242 Localized swelling, mass and lump, left lower limb: Secondary | ICD-10-CM | POA: Diagnosis not present

## 2023-10-16 DIAGNOSIS — Z0289 Encounter for other administrative examinations: Secondary | ICD-10-CM | POA: Insufficient documentation

## 2023-10-16 NOTE — Discharge Instructions (Signed)
Today you are evaluated for your toe swelling, you will be provided coverage for both infection and inflammation  Begin prednisone every morning as directed, stop use of Celebrex during treatment in efforts to not place stress on the kidneys and stomach, take with food  Begin doxycycline every morning and every evening for 7 days to provide coverage for bacteria  You may use ice or heat over the affected area in 10 to 15-minute intervals  You may continue Epsom salt soaks as needed for additional comfort  You may elevate whenever sitting and lying  You may continue activity as tolerated  You may follow-up with his urgent care as needed for any concerns regarding healing

## 2023-10-16 NOTE — ED Triage Notes (Signed)
Patient presents to [facility] for left 4th toe pain x 2 days. Reports redness and swelling has worsened. States concerned with possible gout since he had red meat the night before. Treating pain with Celebrex with some relief.

## 2023-10-16 NOTE — ED Provider Notes (Signed)
Wayne Mcgee    CSN: [id number] Arrival date & time: 10/16/23  1447      History   Chief Complaint Chief Complaint  Patient presents with   Toe Pain    HPI Wayne Mcgee is a 52 y.o. male.   Patient presents for evaluation of pain redness and swelling to the base of the fourth left toe beginning 2 days ago.  Redness extending into the foot all the way to the ankle on day 2 of illness, improved after soaking in Epsom salt for 1 hour.  Pain is described as a throbbing sensation can be felt with bearing weight but able to complete all range of motion.  Denies injury or trauma, numbness or tingling.  Pain has lessened with use of Celebrex.  Concerned with infection but does not bug bite or opening in the skin.  History reviewed. No pertinent past medical history.  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 No pertinent surgical history.     Home Medications    Prior to Admission medications   Medication Sig Start Date End Date Taking? Authorizing Provider  celecoxib (CELEBREX) 200 MG capsule  05/05/23  Yes [provider]  Tdap Leda Min) 5-2.5-18.5 LF-MCG/0.5 injection  02/13/23  Yes [provider]  Zoster Vaccine Adjuvanted [facility]) injection  02/13/23  Yes [provider]  glucosamine-chondroitin (MAX GLUCOSAMINE CHONDROITIN) 500-400 MG tablet Take 1 tablet by mouth 2 (two)  times daily. 03/23/16   [name], MD    Family History History reviewed. No pertinent family history.  Social History Social History   Tobacco Use   Smoking status: Never   Smokeless tobacco: Never  Vaping Use   Vaping status: Never Used  Substance Use Topics   Alcohol use: No   Drug use: No     Allergies   Sulfa antibiotics   Review of Systems Review of Systems   Physical Exam Triage Vital Signs ED Triage Vitals  Encounter Vitals Group     BP 10/16/23 1530 136/87     Systolic BP Percentile --      Diastolic BP Percentile --      Pulse Rate 10/16/23 1530 67     Resp 10/16/23 1530 18     Temp 10/16/23 1530 97.8 F (36.6 C)     Temp Source 10/16/23 1530 Temporal     SpO2 10/16/23 1530 97 %     Weight --      Height --      Head Circumference --      Peak Flow --      Pain Score 10/16/23 1534 2     Pain Loc --      Pain Education --      Exclude from Growth Chart --    No data found.  Updated Vital Signs BP 136/87 (BP Location: Left Arm)   Pulse 67   Temp 97.8 F (36.6 C) (Temporal)   Resp 18  SpO2 97%   Visual Acuity Right Eye Distance:   Left Eye Distance:   Bilateral Distance:    Right Eye Near:   Left Eye Near:    Bilateral Near:     Physical Exam Constitutional:      Appearance: Normal appearance.  Eyes:     Extraocular Movements: Extraocular movements intact.  Pulmonary:     Effort: Pulmonary effort is normal.  Skin:    Comments: Erythema, mild to moderate swelling and tenderness at the base of the fourth toe of the left foot, 2+ pedal pulse, sensation intact, capillary refill less than 3  Neurological:     Mental Status: He is alert and oriented to person, place, and time. Mental status is at baseline.      UC Treatments / Results  Labs (all labs ordered are listed, but only abnormal results are displayed) Labs Reviewed - No data to display  EKG   Radiology No results found.  Procedures Procedures (including  critical care time)  Medications Ordered in UC Medications - No data to display  Initial Impression / Assessment and Plan / UC Course  I have reviewed the triage vital signs and the nursing notes.  Pertinent labs & imaging results that were available during my care of the patient were reviewed by me and considered in my medical decision making (see chart for details).  Localized swelling of toe of left foot  Presentation consistent with an inflammatory cause, low suspicion for bone involvement as there is no injury therefore imaging deferred, discussed this with patient, concern for infection, at this time low suspicion but will provide coverage for both, prednisone taper and doxycycline prescribed recommended continue supportive care through RICE, heat massage stretching and activity as tolerated, may follow-up with urgent care as needed if symptoms persist or worsen Final Clinical Impressions(s) / UC Diagnoses   Final diagnoses:  Localized swelling of toe of left foot     Discharge Instructions      Today you are evaluated for your toe swelling, you will be provided coverage for both infection and inflammation  Begin prednisone every morning as directed, stop use of Celebrex during treatment in efforts to not place stress on the kidneys and stomach, take with food  Begin doxycycline every morning and every evening for 7 days to provide coverage for bacteria  You may use ice or heat over the affected area in 10 to 15-minute intervals  You may continue Epsom salt soaks as needed for additional comfort  You may elevate whenever sitting and lying  You may continue activity as tolerated  You may follow-up with his urgent care as needed for any concerns regarding healing   ED Prescriptions   None    PDMP not reviewed this encounter.   Valinda Hoar, [place] 10/16/23 [phone]

## 2023-10-17 ENCOUNTER — Telehealth: Payer: Self-pay

## 2023-10-17 MED ORDER — PREDNISONE 10 MG PO TABS: 40.0000 mg | ORAL_TABLET | Freq: Every day | ORAL | 0 refills | Status: AC

## 2023-10-17 MED ORDER — DOXYCYCLINE HYCLATE 100 MG PO CAPS: 100.0000 mg | ORAL_CAPSULE | Freq: Two times a day (BID) | ORAL | 0 refills | Status: AC
